# Patient Record
Sex: Male | Born: 1965 | Race: Black or African American | Hispanic: No | Marital: Married | Smoking: Never smoker
Health system: Southern US, Community
[De-identification: ages and names within clinical notes are randomized; demographics above are authoritative.]

## PROBLEM LIST (undated history)

## (undated) DIAGNOSIS — E785 Hyperlipidemia, unspecified: Secondary | ICD-10-CM

## (undated) DIAGNOSIS — I10 Essential (primary) hypertension: Secondary | ICD-10-CM

---

## 2001-07-20 ENCOUNTER — Ambulatory Visit: Admission: RE | Admit: 2001-07-20 | Discharge: 2001-07-20 | Payer: Self-pay | Admitting: Critical Care Medicine

## 2001-07-20 ENCOUNTER — Encounter: Payer: Self-pay | Admitting: Critical Care Medicine

## 2011-05-03 ENCOUNTER — Emergency Department (INDEPENDENT_AMBULATORY_CARE_PROVIDER_SITE_OTHER): Payer: Managed Care, Other (non HMO)

## 2011-05-03 ENCOUNTER — Emergency Department (HOSPITAL_BASED_OUTPATIENT_CLINIC_OR_DEPARTMENT_OTHER)
Admission: EM | Admit: 2011-05-03 | Discharge: 2011-05-03 | Discharge status: Against Medical Advice | Payer: Managed Care, Other (non HMO) | Attending: Emergency Medicine | Admitting: Emergency Medicine

## 2011-05-03 ENCOUNTER — Other Ambulatory Visit: Payer: Self-pay

## 2011-05-03 ENCOUNTER — Encounter: Payer: Self-pay | Admitting: *Deleted

## 2011-05-03 DIAGNOSIS — R55 Syncope and collapse: Secondary | ICD-10-CM | POA: Insufficient documentation

## 2011-05-03 DIAGNOSIS — R079 Chest pain, unspecified: Secondary | ICD-10-CM | POA: Insufficient documentation

## 2011-05-03 DIAGNOSIS — R110 Nausea: Secondary | ICD-10-CM | POA: Insufficient documentation

## 2011-05-03 DIAGNOSIS — R1013 Epigastric pain: Secondary | ICD-10-CM

## 2011-05-03 DIAGNOSIS — R0602 Shortness of breath: Secondary | ICD-10-CM

## 2011-05-03 DIAGNOSIS — R9431 Abnormal electrocardiogram [ECG] [EKG]: Secondary | ICD-10-CM | POA: Insufficient documentation

## 2011-05-03 DIAGNOSIS — E785 Hyperlipidemia, unspecified: Secondary | ICD-10-CM | POA: Insufficient documentation

## 2011-05-03 HISTORY — DX: Hyperlipidemia, unspecified: E78.5

## 2011-05-03 LAB — DIFFERENTIAL
Basophils Absolute: 0 10*3/uL (ref 0.0–0.1)
Basophils Relative: 0 % (ref 0–1)
Eosinophils Absolute: 0.1 10*3/uL (ref 0.0–0.7)
Eosinophils Relative: 2 % (ref 0–5)
Lymphocytes Relative: 32 % (ref 12–46)
Lymphs Abs: 1.6 10*3/uL (ref 0.7–4.0)
Monocytes Absolute: 0.5 10*3/uL (ref 0.1–1.0)
Monocytes Relative: 10 % (ref 3–12)
Neutro Abs: 2.9 10*3/uL (ref 1.7–7.7)
Neutrophils Relative %: 56 % (ref 43–77)

## 2011-05-03 LAB — CBC
HCT: 43.5 % (ref 39.0–52.0)
Hemoglobin: 14.8 g/dL (ref 13.0–17.0)
MCH: 26.4 pg (ref 26.0–34.0)
MCHC: 34 g/dL (ref 30.0–36.0)
MCV: 77.5 fL — ABNORMAL LOW (ref 78.0–100.0)
Platelets: 204 10*3/uL (ref 150–400)
RBC: 5.61 MIL/uL (ref 4.22–5.81)
RDW: 13.9 % (ref 11.5–15.5)
WBC: 5.1 10*3/uL (ref 4.0–10.5)

## 2011-05-03 LAB — COMPREHENSIVE METABOLIC PANEL
ALT: 17 U/L (ref 0–53)
AST: 22 U/L (ref 0–37)
Alkaline Phosphatase: 62 U/L (ref 39–117)
CO2: 25 mEq/L (ref 19–32)
Calcium: 8.9 mg/dL (ref 8.4–10.5)
Chloride: 102 mEq/L (ref 96–112)
GFR calc non Af Amer: 60 mL/min — ABNORMAL LOW (ref >60)
Potassium: 4 mEq/L (ref 3.5–5.1)
Sodium: 138 mEq/L (ref 135–145)

## 2011-05-03 LAB — CARDIAC PANEL(CRET KIN+CKTOT+MB+TROPI): CK, MB: 1.6 ng/mL (ref 0.3–4.0)

## 2011-05-03 MED ORDER — IOHEXOL 350 MG/ML SOLN
80.0000 mL | Freq: Once | INTRAVENOUS | Status: AC | PRN
  Administered 2011-05-03: 80 mL via INTRAVENOUS

## 2011-05-03 MED ORDER — ASPIRIN 81 MG PO CHEW
324.0000 mg | CHEWABLE_TABLET | Freq: Once | ORAL | Status: AC
  Administered 2011-05-03: 324 mg via ORAL
  Filled 2011-05-03: qty 4

## 2011-05-03 NOTE — ED Provider Notes (Signed)
History     CSN: [id number] Arrival date & time: 05/03/2011 11:43 AM  Chief Complaint  Patient presents with  . Near Syncope  . Abdominal Pain  . Nausea   Patient is a 45 y.o. male presenting with abdominal pain. The history is provided by the patient. No language interpreter was used.  Abdominal Pain The primary symptoms of the illness include abdominal pain and nausea. The primary symptoms of the illness do not include shortness of breath or vomiting. Primary symptoms comment: epigastric pain without radiation episodic x 5-6 The onset of the illness was gradual. The problem has been gradually worsening (episodic).  The patient states that she believes she is currently not pregnant. The patient has not had a change in bowel habit. Additional symptoms associated with the illness include diaphoresis. Symptoms associated with the illness do not include chills, anorexia, constipation, urgency, hematuria, frequency or back pain. Significant associated medical issues do not include PUD, GERD, inflammatory bowel disease, diabetes, sickle cell disease, gallstones, liver disease, substance abuse, diverticulitis or HIV.    Past Medical History  Diagnosis Date  . Hyperlipidemia     History reviewed. No pertinent past surgical history.  History reviewed. No pertinent family history.  History  Substance Use Topics  . Smoking status: Never Smoker   . Smokeless tobacco: Never Used  . Alcohol Use: 1.0 oz/week    2 drink(s) per week      Review of Systems  Constitutional: Positive for diaphoresis. Negative for chills.  HENT: Positive for facial swelling.   Respiratory: Negative for shortness of breath and wheezing.        Dyspnea on exertion  Cardiovascular: Negative for palpitations.  Gastrointestinal: Positive for nausea and abdominal pain. Negative for vomiting, constipation, blood in stool, abdominal distention and anorexia.  Genitourinary: Negative for urgency, frequency and hematuria.    Musculoskeletal: Negative for back pain.  Skin: Negative.   Neurological: Positive for syncope.  Hematological: Negative.   Psychiatric/Behavioral: Negative.     Physical Exam  BP 150/107  Pulse 112  Temp(Src) 98.7 F (37.1 C) (Oral)  Resp 20  SpO2 99%  Physical Exam  Constitutional: He is oriented to person, place, and time. He appears well-developed and well-nourished. No distress.  HENT:  Head: Normocephalic and atraumatic.  Eyes: EOM are normal. Pupils are equal, round, and reactive to light.  Neck: Normal range of motion. Neck supple.  Cardiovascular: Normal rate and regular rhythm.   No murmur heard. Pulmonary/Chest: Effort normal and breath sounds normal. No respiratory distress.  Abdominal: Soft. Bowel sounds are normal. He exhibits no distension and no mass. There is no tenderness. There is no rebound and no guarding.  Musculoskeletal: Normal range of motion.  Neurological: He is alert and oriented to person, place, and time.  Skin: Skin is warm and dry.    ED Course  Procedures  MDM   Date: 05/03/2011  Rate: 101  Rhythm: sinus tachycardia  QRS Axis: normal  Intervals: normal  ST/T Wave abnormalities: nonspecific ST changes  Conduction Disutrbances:none  Narrative Interpretation: ST T changes inferiorly and laterally  Old EKG Reviewed: none available     Following admission patient decided he no longer wished to be admitted.  Nurse Jasmine December and EDP informed patient the risks of signing AMA were death, heart attack arrhythmia with death or disability.  Further syncope with trauma and morbidity due to trauma.  Patient verbalizes understanding of risks and is able to verbalizes that he understands the seriousness of his  condition and has the decision making capacity to refuse care.     Jasmine Awe, MD 05/03/11 [phone]

## 2011-05-03 NOTE — ED Notes (Signed)
Pt will be admitted to [facility] hospital room 462.

## 2011-05-03 NOTE — ED Notes (Signed)
Reports history of 5 to 6 days of upper abdominal pain and intermittent nausea today he was working in the garage and started having the pain then got "faint feeling"

## 2013-03-12 IMAGING — CR DG CHEST 2V
2 series · 2 of 2 positions shown · non-contrast
Comparison: None.

CLINICAL DATA: Epigastric pain

CHEST - 2 VIEW

[w chest pa]
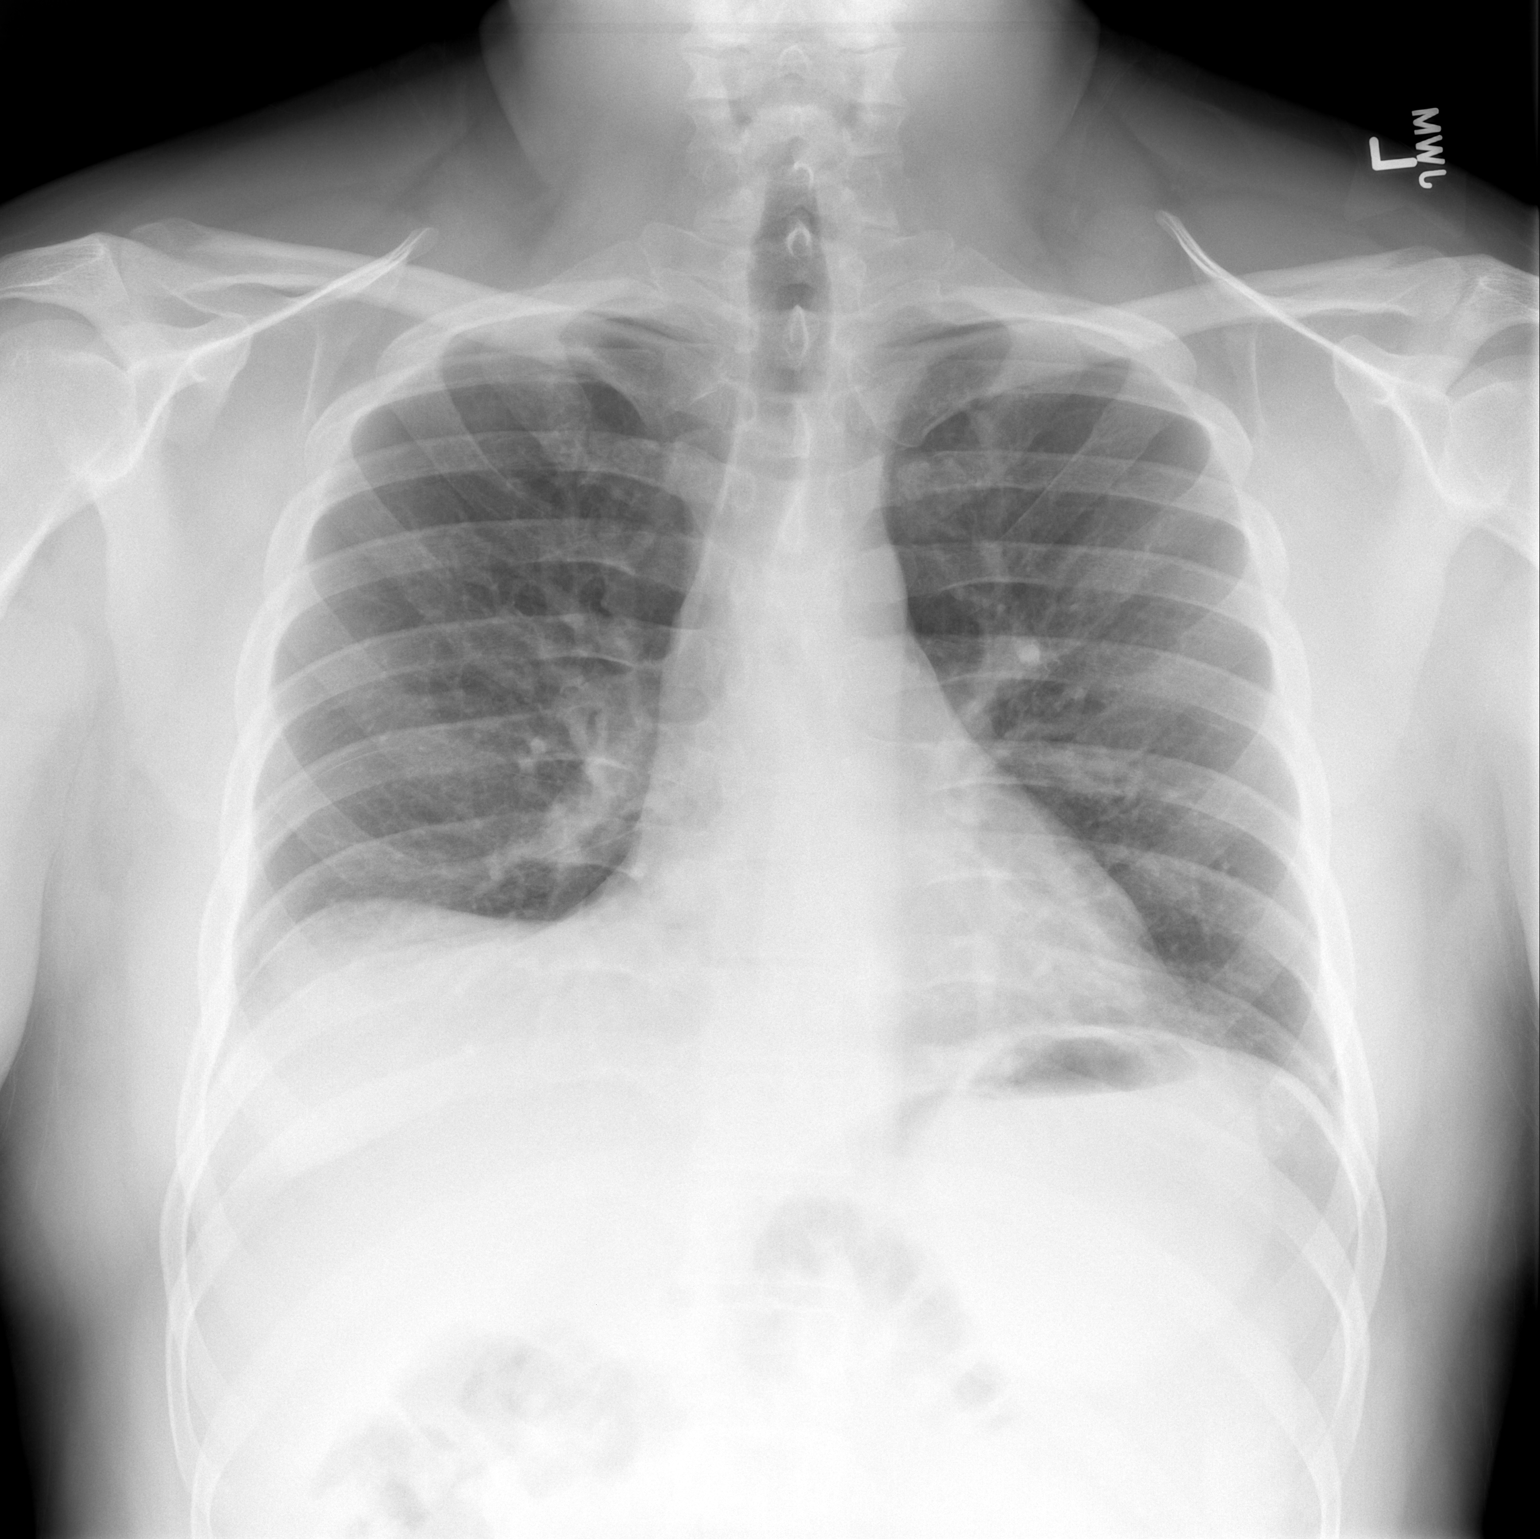

[w chest lat]
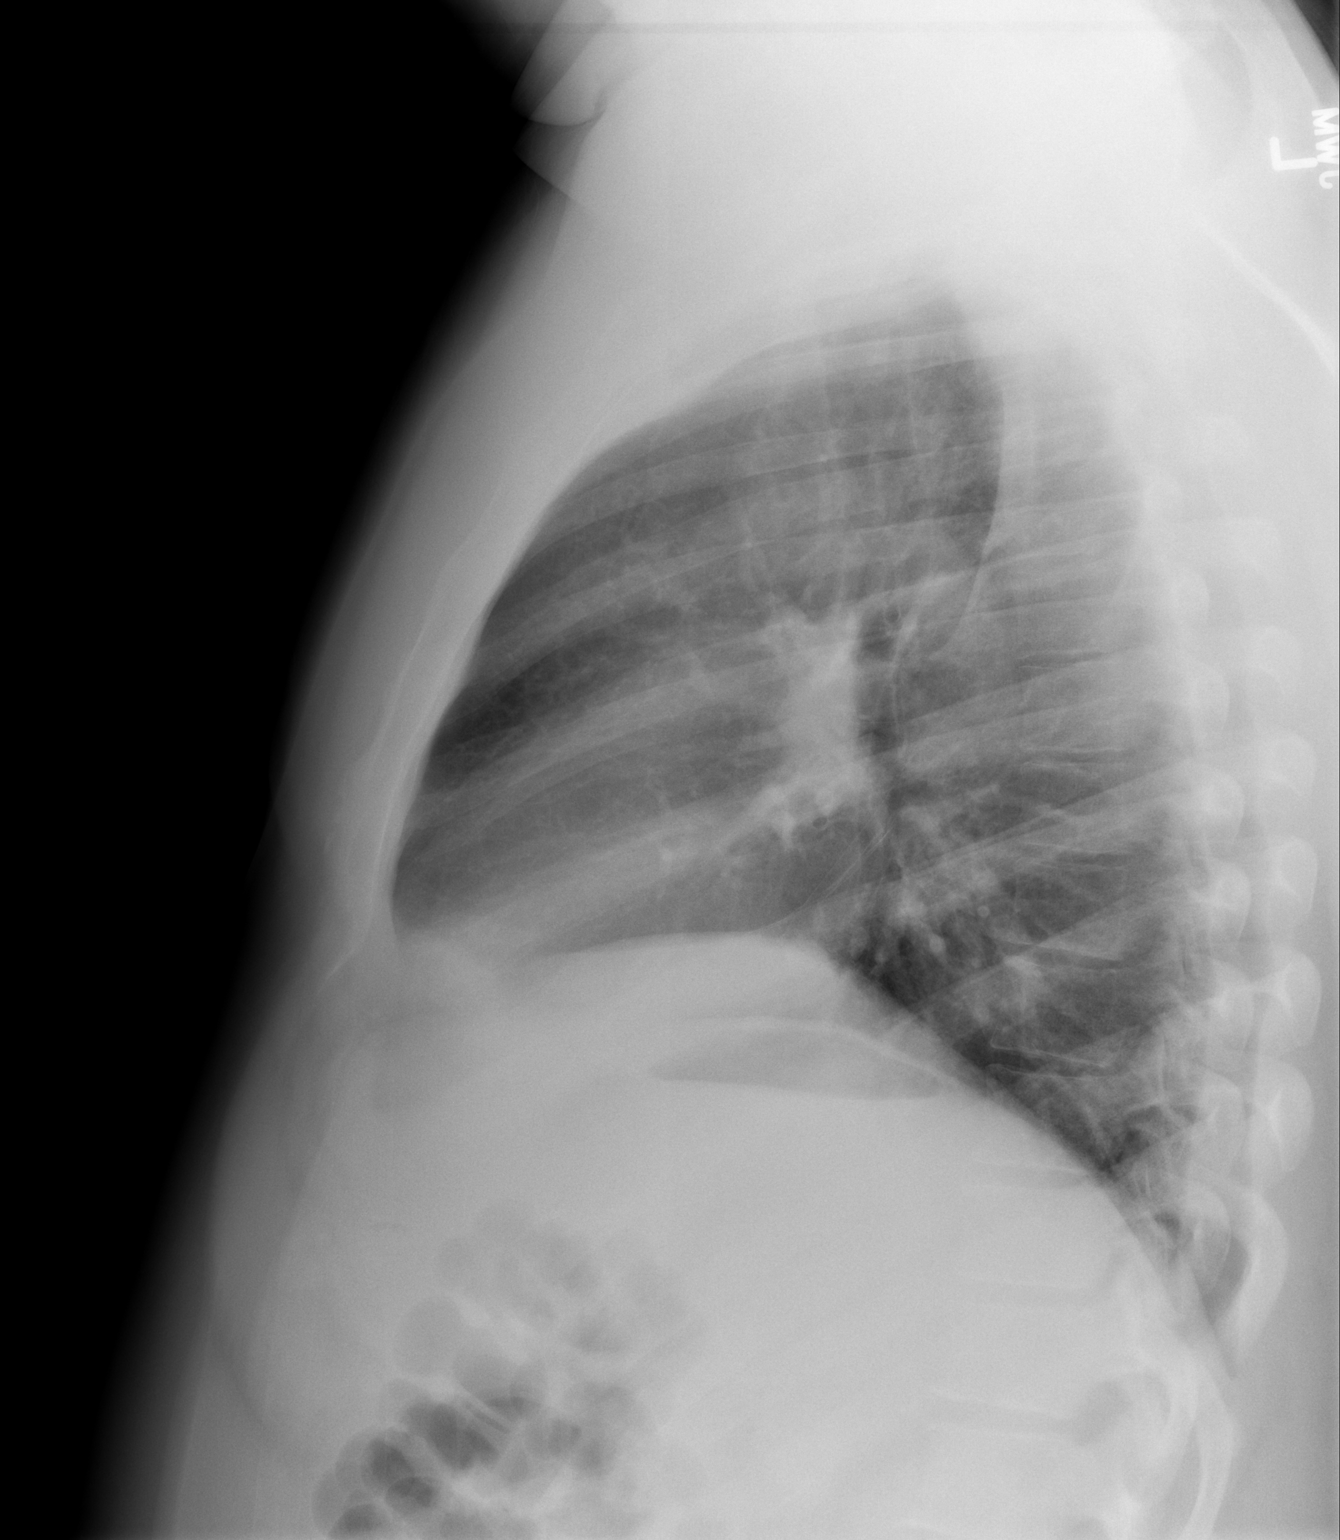

[2 of 2 positions shown; findings below may reference images not displayed]

FINDINGS: Normal heart size.  Clear lungs.  No pneumothorax and no
pleural effusion. Low volumes.
IMPRESSION: No active cardiopulmonary disease.

## 2013-03-12 IMAGING — CT CT ANGIO CHEST
2 of 6 series · 19 of 36 positions shown · IV contrast (APPLIED)
Comparison: None.

CLINICAL DATA: Shortness of breath, syncope.

CT ANGIOGRAPHY CHEST WITH CONTRAST
TECHNIQUE: Multidetector CT imaging of the chest was performed
using the standard protocol during bolus administration of
intravenous contrast.  Multiplanar CT image reconstructions
including MIPs were obtained to evaluate the vascular anatomy.
Contrast:  80 ml Omnipaque 300 IV.

[Series 6: pe 1.0 b25f · axial · 0.65mm/px · z∈[-250,-30]mm · 18 of 246 slices shown]
[im 13/246  lung]
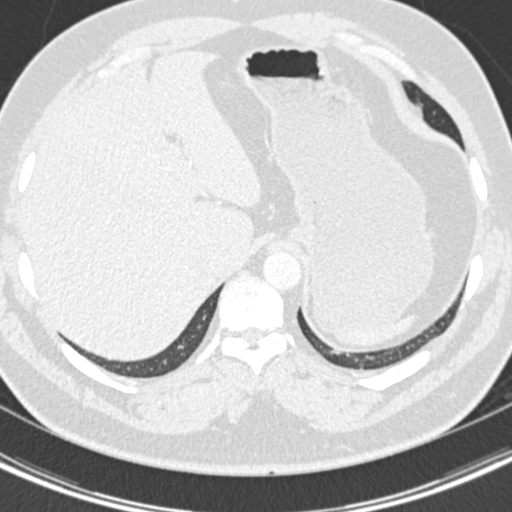
[im 25/246  mediastinal]
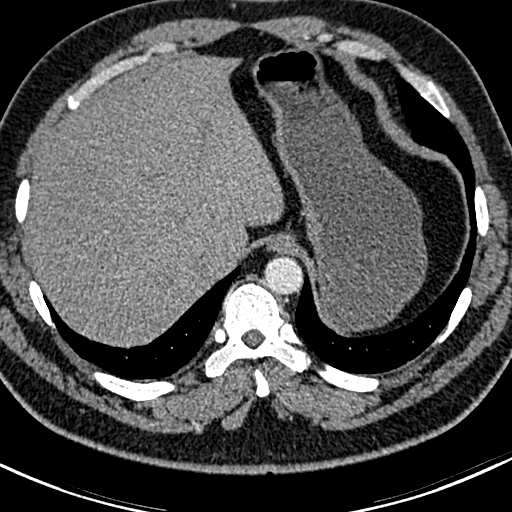
[im 37/246  lung]
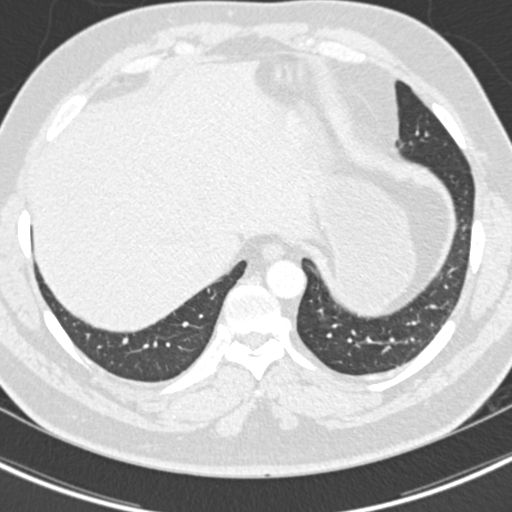
[im 50/246  mediastinal]
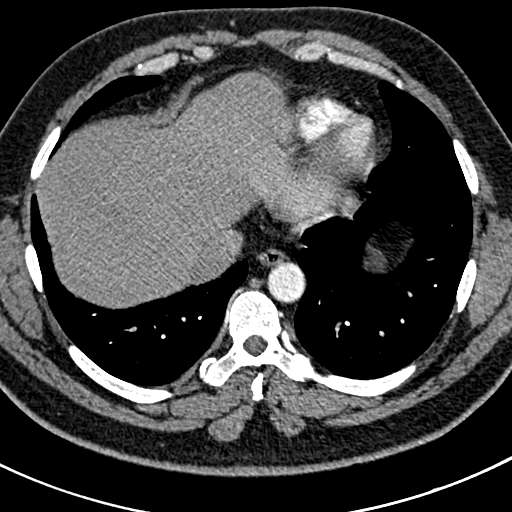
[im 62/246  lung]
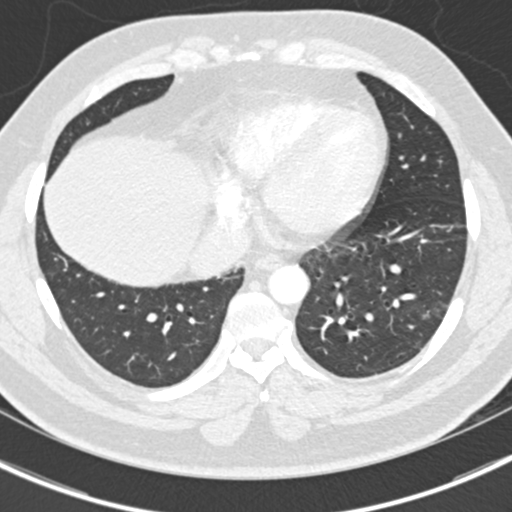
[im 74/246  mediastinal]
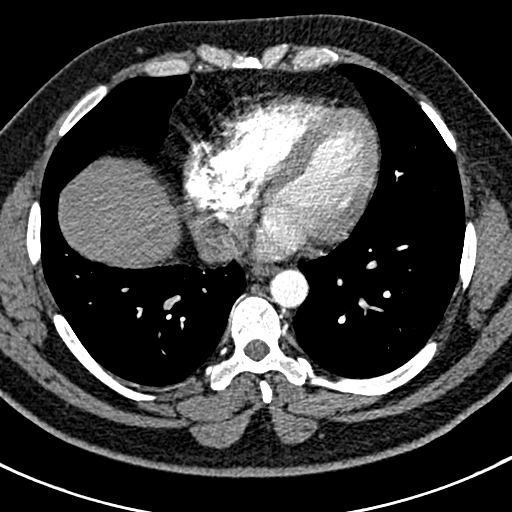
[im 86/246  lung]
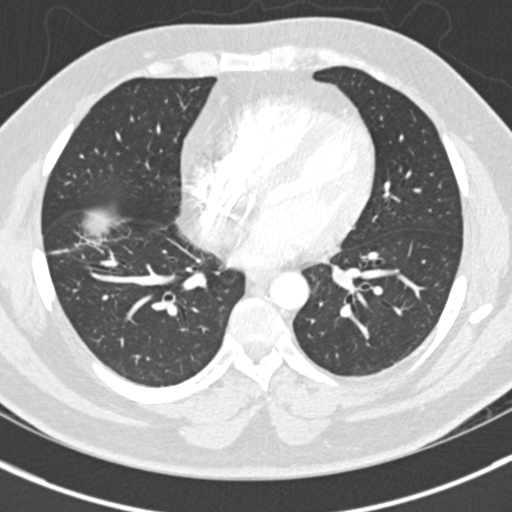
[im 99/246  mediastinal]
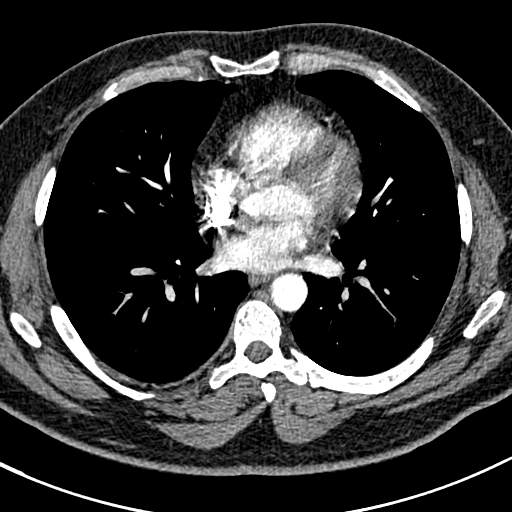
[im 111/246  lung]
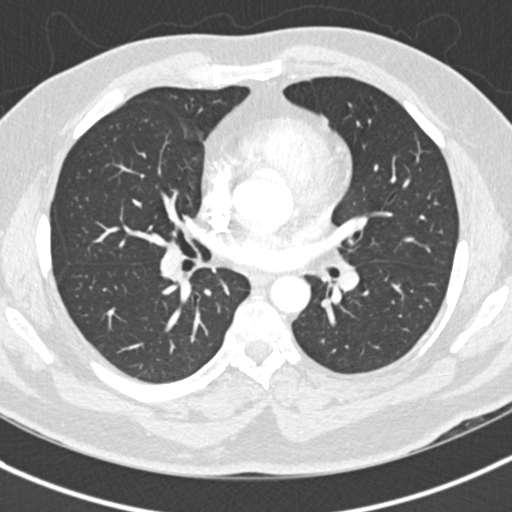
[im 135/246  mediastinal]
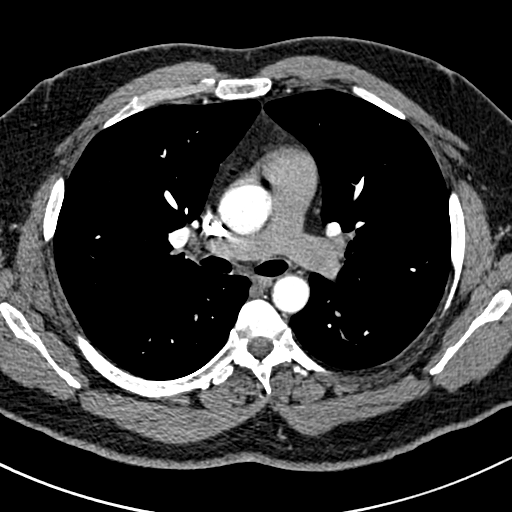
[im 148/246  lung]
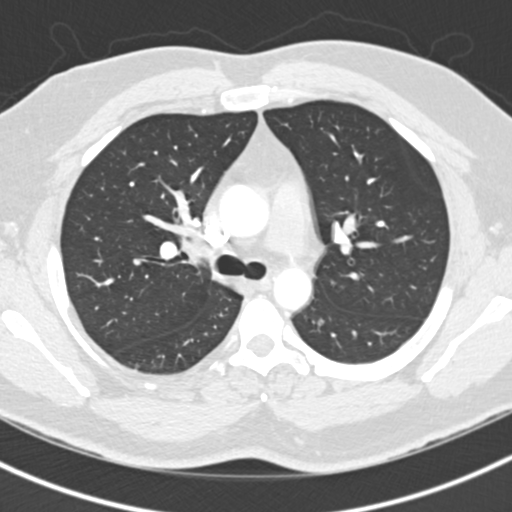
[im 160/246  mediastinal]
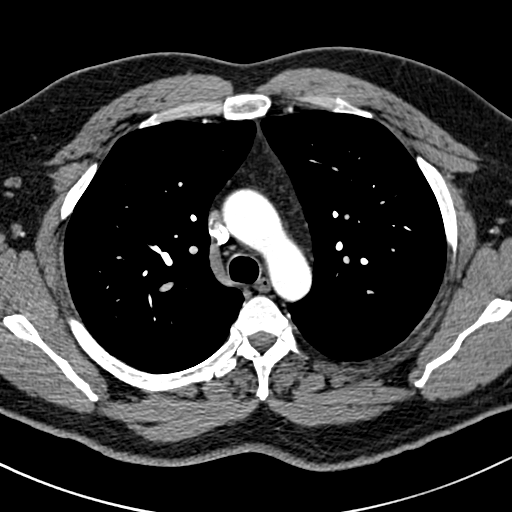
[im 172/246  lung]
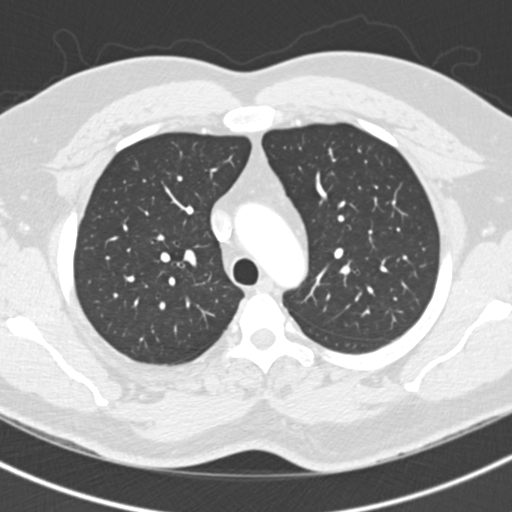
[im 184/246  mediastinal]
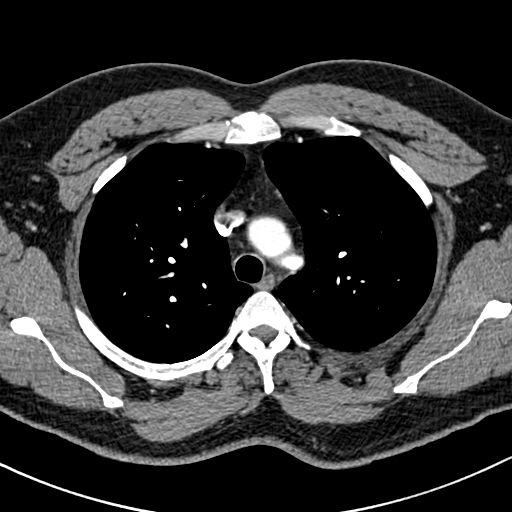
[im 197/246  lung]
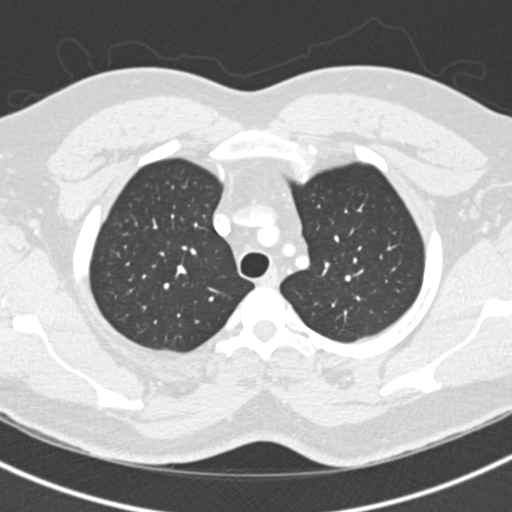
[im 209/246  mediastinal]
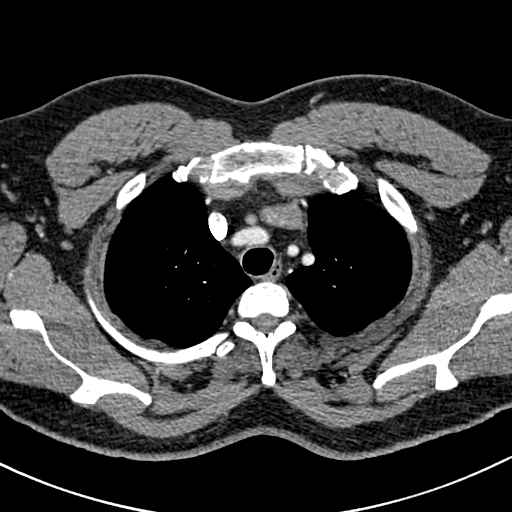
[im 221/246  lung]
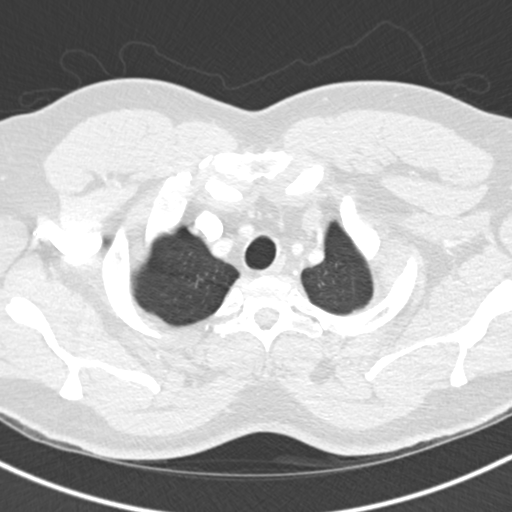
[im 233/246  mediastinal]
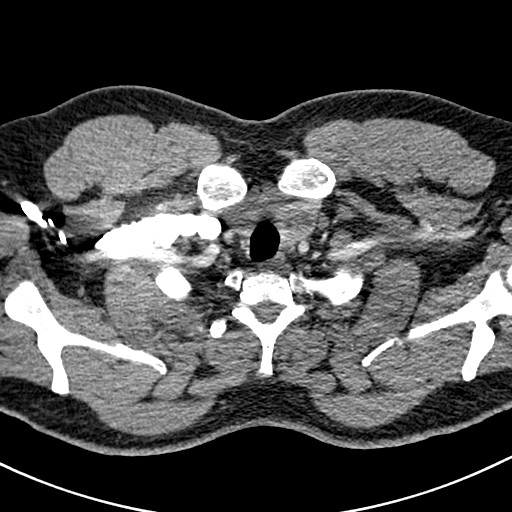

[Series 9: pe 2.0 coronal · coronal · 0.48mm/px · 1 of 145 slices shown]
[im 73/145  mediastinal]
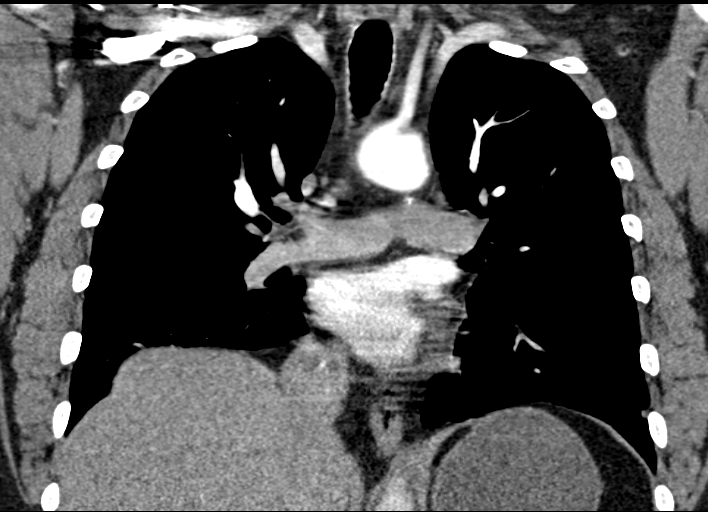

[19 of 36 positions shown; findings below may reference images not displayed]

FINDINGS: No filling defects in the pulmonary arteries to suggest
pulmonary emboli. Heart is normal size. Aorta is normal caliber.
No mediastinal, hilar, or axillary adenopathy.  Visualized thyroid
and chest wall soft tissues unremarkable. Lungs are clear.  No
focal airspace opacities or suspicious nodules.  No effusions.
Imaging into the upper abdomen shows no acute findings.

Review of the MIP images confirms the above findings.
IMPRESSION: No evidence of pulmonary embolus.

No acute findings.

## 2015-07-01 ENCOUNTER — Encounter (HOSPITAL_BASED_OUTPATIENT_CLINIC_OR_DEPARTMENT_OTHER): Payer: Self-pay

## 2015-07-01 ENCOUNTER — Emergency Department (HOSPITAL_BASED_OUTPATIENT_CLINIC_OR_DEPARTMENT_OTHER)
Admission: EM | Admit: 2015-07-01 | Discharge: 2015-07-01 | Disposition: A | Discharge status: Home / Self Care | Payer: 59 | Attending: Emergency Medicine | Admitting: Emergency Medicine

## 2015-07-01 DIAGNOSIS — E785 Hyperlipidemia, unspecified: Secondary | ICD-10-CM | POA: Diagnosis not present

## 2015-07-01 DIAGNOSIS — H539 Unspecified visual disturbance: Secondary | ICD-10-CM

## 2015-07-01 DIAGNOSIS — H538 Other visual disturbances: Secondary | ICD-10-CM | POA: Insufficient documentation

## 2015-07-01 DIAGNOSIS — I10 Essential (primary) hypertension: Secondary | ICD-10-CM

## 2015-07-01 HISTORY — DX: Essential (primary) hypertension: I10

## 2015-07-01 MED ORDER — LOSARTAN POTASSIUM 100 MG PO TABS: 50.0000 mg | ORAL_TABLET | Freq: Every day | ORAL | Status: AC

## 2015-07-01 NOTE — ED Provider Notes (Signed)
CSN: [id number]     Arrival date & time 07/01/15  1643 History   First MD Initiated Contact with Patient 07/01/15 1735     Chief Complaint  Patient presents with  . Visual Field Change   HPI   49 year old male presents today with facial changes. Patient reports that he was at work today and noticed a change in his vision, describes it is blurry. He reports at that time he went to the local fire station and had his blood pressure checked which was found to be in the low 200s systolic. Patient reports that at that time he was extremely anxious, with deep breathing was able to get his anxiety and control, with subsequent lowering of his blood pressure. He notes after his blood pressure lowered he did not have any visual complaints. At the time of evaluation patient reports he is back to his normal baseline, he did eyes changes in vision, smell, taste, headache, chest pain, shortness of breath, abdominal pain, or changes in the color clarity or characteristics of his urine. Patient reports that he used to take blood pressure medication taking losartan 10 mg daily, but only took it for sure. At times it causes nausea. Patient is uncertain if this was due to taking and on empty stomach or if it was related to the medication. Patient notes that he's been under significant stress lately as his mother recently passed, has had difficulty sleeping. Patient reports a history of high cholesterol, currently taking statin. Patient has no complaints at the time my evaluation other than those noted above.  Past Medical History  Diagnosis Date  . Hyperlipidemia   . Hypertension    History reviewed. No pertinent past surgical history. No family history on file. Social History  Substance Use Topics  . Smoking status: Never Smoker   . Smokeless tobacco: Never Used  . Alcohol Use: Yes     Comment: occ    Review of Systems  All other systems reviewed and are negative.   Allergies  Review of patient's  allergies indicates no known allergies.  Home Medications   Prior to Admission medications   Medication Sig Start Date End Date Taking? Authorizing Provider  losartan (COZAAR) 100 MG tablet Take 0.5 tablets (50 mg total) by mouth daily. 07/01/15   Eyvonne Mechanic, PA-C  simvastatin (ZOCOR) 10 MG tablet Take 10 mg by mouth at bedtime.      Historical Provider, MD   BP 150/97 mmHg  Pulse 103  Temp(Src) 98 F (36.7 C) (Oral)  Resp 20  Ht 6' (1.829 m)  Wt 271 lb (122.925 kg)  BMI 36.75 kg/m2  SpO2 100%   Physical Exam  Constitutional: He is oriented to person, place, and time. He appears well-developed and well-nourished.  HENT:  Head: Normocephalic and atraumatic.  Eyes: Conjunctivae are normal. Pupils are equal, round, and reactive to light. Right eye exhibits no discharge. Left eye exhibits no discharge. No scleral icterus.  Neck: Normal range of motion. No JVD present. No tracheal deviation present.  Cardiovascular: Regular rhythm, normal heart sounds and intact distal pulses.  Exam reveals no gallop and no friction rub.   No murmur heard. Pulmonary/Chest: Effort normal and breath sounds normal. No stridor. No respiratory distress. He has no wheezes. He has no rales.  Abdominal: Soft. Bowel sounds are normal. He exhibits no mass. There is no tenderness. There is no rebound and no guarding.  Musculoskeletal: Normal range of motion. He exhibits no edema or tenderness.  Neurological:  He is alert and oriented to person, place, and time. He has normal strength. He is not disoriented. No cranial nerve deficit or sensory deficit. Coordination and gait normal. GCS eye subscore is 4. GCS verbal subscore is 5. GCS motor subscore is 6.  Skin: Skin is warm and dry. No rash noted. No erythema. No pallor.  Psychiatric: He has a normal mood and affect. His behavior is normal. Judgment and thought content normal.  Nursing note and vitals reviewed.   ED Course  Procedures (including critical care  time) Labs Review Labs Reviewed - No data to display  Imaging Review No results found. I have personally reviewed and evaluated these images and lab results as part of my medical decision-making.   EKG Interpretation None      MDM   Final diagnoses:  Visual disturbance  Essential hypertension    Labs:  Imaging:  Consults:  Therapeutics:   Discharge Meds: Losartan  Assessment/Plan: Patient presents with an episode of visual disturbance and hypertension. At the time my evaluation patient was asymptomatic with no complaints. This could likely represent a transient rise in his blood pressure. She has no signs of end organ damage at this time, he will be prescribed his antihypertensive medications, encouraged follow-up with his primary care for reevaluation and management of his chronic conditions. Patient verbalized understanding and agreement for today's plan, he assured his follow-up evaluation, he assured that he return to the emergency room if symptoms return.         Eyvonne Mechanic, PA-C 07/01/15 1478  Elwin Mocha, MD 07/01/15 2322

## 2015-07-01 NOTE — ED Notes (Signed)
Pa  at bedside. 

## 2015-07-01 NOTE — Discharge Instructions (Signed)
Please take medication as directed, consult her primary care provider for further evaluation and management of her hypertension. If symptoms return please return to the emergency room for further evaluation and management.

## 2015-07-01 NOTE — ED Notes (Addendum)
C/o visual disturbance started 3pm-[place] [facility] [place] and was advised elevated BP-pt states hx of HTN with meds x 1 month then stopped due to nausea but stopped x 4 months-felt like when you look into a flashlight and look away and the brightness continues-denies at this time

## 2015-07-02 ENCOUNTER — Telehealth: Payer: Self-pay | Admitting: *Deleted

## 2015-07-02 NOTE — Telephone Encounter (Signed)
Pt called inquiring about dosage of Rx.  Pt states his previous dosage was 12.5mg  and he was prescribed .  NCM suggested he call/visit his PCP for adjustment as ER does not manage medications.  Pt verbalized understanding.

## 2017-06-17 ENCOUNTER — Encounter (HOSPITAL_BASED_OUTPATIENT_CLINIC_OR_DEPARTMENT_OTHER): Payer: Self-pay | Admitting: *Deleted

## 2017-06-17 ENCOUNTER — Emergency Department (HOSPITAL_BASED_OUTPATIENT_CLINIC_OR_DEPARTMENT_OTHER): Payer: Worker's Compensation

## 2017-06-17 ENCOUNTER — Emergency Department (HOSPITAL_BASED_OUTPATIENT_CLINIC_OR_DEPARTMENT_OTHER)
Admission: EM | Admit: 2017-06-17 | Discharge: 2017-06-17 | Disposition: A | Discharge status: Home / Self Care | Payer: Worker's Compensation | Attending: Emergency Medicine | Admitting: Emergency Medicine

## 2017-06-17 DIAGNOSIS — E785 Hyperlipidemia, unspecified: Secondary | ICD-10-CM | POA: Diagnosis not present

## 2017-06-17 DIAGNOSIS — M25562 Pain in left knee: Secondary | ICD-10-CM | POA: Insufficient documentation

## 2017-06-17 DIAGNOSIS — I10 Essential (primary) hypertension: Secondary | ICD-10-CM | POA: Insufficient documentation

## 2017-06-17 DIAGNOSIS — Z79899 Other long term (current) drug therapy: Secondary | ICD-10-CM | POA: Diagnosis not present

## 2017-06-17 MED ORDER — MELOXICAM 7.5 MG PO TABS: 15.0000 mg | ORAL_TABLET | Freq: Every day | ORAL | 0 refills | Status: AC

## 2017-06-17 NOTE — Discharge Instructions (Signed)
Take the prescribed medication as directed.  Can ice and elevate at home to try and help with symptoms if desired. Follow-up with your primary care doctor. Return to the ED for new or worsening symptoms.

## 2017-06-17 NOTE — ED Triage Notes (Signed)
Pt says he was at work trying to take a picture off a wall and his knee buckled and fell against the wall. He c/o pain in the left knee and request UDS for work.

## 2017-06-17 NOTE — ED Provider Notes (Signed)
[facility]   CSN: [id number] Arrival date & time: 06/17/17  1753     History   Chief Complaint Chief Complaint  Patient presents with  . Knee Pain    HPI Darin Winters is a 51 y.o. male.  The history is provided by the patient and medical records.  Knee Pain      51 year old male with history of hyperlipidemia and hypertension, presenting to the ED for left knee pain. Patient reports he was hanging up a picture at work today when he tripped causing his left knee hit the wall. He did not fall to the ground. States since then he has had some "pressure" in his left knee when walking.  States he has felt some "popping" and some "grinding" as well. He denies any numbness or weakness of the leg. No prior left knee injuries or surgeries.  Past Medical History:  Diagnosis Date  . Hyperlipidemia   . Hypertension     There are no active problems to display for this patient.   History reviewed. No pertinent surgical history.     Home Medications    Prior to Admission medications   Medication Sig Start Date End Date Taking? Authorizing Provider  losartan (COZAAR) 100 MG tablet Take 0.5 tablets (50 mg total) by mouth daily. 07/01/15   Hedges, Tinnie Gens, PA-C  simvastatin (ZOCOR) 10 MG tablet Take 10 mg by mouth at bedtime.      [provider]    Family History No family history on file.  Social History Social History  Substance Use Topics  . Smoking status: Never Smoker  . Smokeless tobacco: Never Used  . Alcohol use Yes     Comment: occ     Allergies   Patient has no known allergies.   Review of Systems Review of Systems  Musculoskeletal: Positive for arthralgias.  All other systems reviewed and are negative.    Physical Exam Updated Vital Signs BP (!) 152/103 (BP Location: Right Arm)   Pulse 96   Temp 98.3 F (36.8 C) (Oral)   Resp 16   Ht 6' (1.829 m)   Wt 122.5 kg (270 lb)   SpO2 99%   BMI 36.62 kg/m    Physical Exam  Constitutional: He is oriented to person, place, and time. He appears well-developed and well-nourished.  HENT:  Head: Normocephalic and atraumatic.  Mouth/Throat: Oropharynx is clear and moist.  Eyes: Pupils are equal, round, and reactive to light. Conjunctivae and EOM are normal.  Neck: Normal range of motion.  Cardiovascular: Normal rate, regular rhythm and normal heart sounds.   Pulmonary/Chest: Effort normal and breath sounds normal. No respiratory distress. He has no wheezes.  Abdominal: Soft. Bowel sounds are normal. There is no tenderness. There is no rebound.  Musculoskeletal: Normal range of motion.  Left knee overall normal in appearance, no evidence of bruising or swelling, no effusion, mild crepitus noted from the patella with flexion and extension but no difficulty or apparent pain with doing so, ambulatory with steady gait  Neurological: He is alert and oriented to person, place, and time.  Skin: Skin is warm and dry.  Psychiatric: He has a normal mood and affect.  Nursing note and vitals reviewed.    ED Treatments / Results  Labs (all labs ordered are listed, but only abnormal results are displayed) Labs Reviewed - No data to display  EKG  EKG Interpretation None       Radiology Dg Knee Complete 4 Views  Left  Result Date: 06/17/2017 CLINICAL DATA:  51 y/o  M; injury with pain. EXAM: LEFT KNEE - COMPLETE 4+ VIEW COMPARISON:  None. FINDINGS: No acute fracture, dislocation, or joint effusion. Mild medial femorotibial compartment joint space narrowing. Spurring of tibial spines. Calcifications within the quadriceps tendon insertion on the patella may represent calcific tendinitis. IMPRESSION: 1. No acute fracture or dislocation identified. 2. Mild osteoarthrosis of the knee with mild medial femorotibial compartment joint space narrowing. Electronically Signed   By: Mitzi Hansen M.D.   On: 06/17/2017 18:42    Procedures Procedures  (including critical care time)  Medications Ordered in ED Medications - No data to display   Initial Impression / Assessment and Plan / ED Course  I have reviewed the triage vital signs and the nursing notes.  Pertinent labs & imaging results that were available during my care of the patient were reviewed by me and considered in my medical decision making (see chart for details).  51 year old male here with left knee pain. Was changing a picture on the wall at work when he lost his footing and fell, impacting the left knee on did not fall to the ground. Has been ambulatory since But reports some "pressure" in the knee. He also feels some popping and grinding. There is no acute deformity on exam. No significant swelling or effusion. No ligamentous laxity.  X-ray obtained, arthritic changes noted but no acute findings. Results discussed with patient. Will start anti-inflammatories. Urine drug screen was performed at patient's request. He was given copies of his x-ray. Close follow-up with PCP.  Discussed plan with patient, he acknowledged understanding and agreed with plan of care.  Return precautions given for new or worsening symptoms.  Final Clinical Impressions(s) / ED Diagnoses   Final diagnoses:  Acute pain of left knee    New Prescriptions Discharge Medication List as of 06/17/2017  7:00 PM    START taking these medications   Details  meloxicam (MOBIC) 7.5 MG tablet Take 2 tablets (15 mg total) by mouth daily., Starting Sat 06/17/2017, Print         Garlon Hatchet, PA-C 06/17/17 Cherie Dark, MD 06/18/17 [phone]

## 2018-12-24 ENCOUNTER — Emergency Department (HOSPITAL_BASED_OUTPATIENT_CLINIC_OR_DEPARTMENT_OTHER): Payer: 59

## 2018-12-24 ENCOUNTER — Emergency Department (HOSPITAL_BASED_OUTPATIENT_CLINIC_OR_DEPARTMENT_OTHER)
Admission: EM | Admit: 2018-12-24 | Discharge: 2018-12-24 | Disposition: A | Discharge status: Home / Self Care | Payer: 59 | Attending: Emergency Medicine | Admitting: Emergency Medicine

## 2018-12-24 ENCOUNTER — Other Ambulatory Visit: Payer: Self-pay

## 2018-12-24 DIAGNOSIS — F419 Anxiety disorder, unspecified: Secondary | ICD-10-CM | POA: Diagnosis not present

## 2018-12-24 DIAGNOSIS — R0600 Dyspnea, unspecified: Secondary | ICD-10-CM | POA: Insufficient documentation

## 2018-12-24 DIAGNOSIS — I10 Essential (primary) hypertension: Secondary | ICD-10-CM | POA: Insufficient documentation

## 2018-12-24 DIAGNOSIS — R002 Palpitations: Secondary | ICD-10-CM | POA: Diagnosis not present

## 2018-12-24 DIAGNOSIS — R0602 Shortness of breath: Secondary | ICD-10-CM | POA: Diagnosis present

## 2018-12-24 LAB — COMPREHENSIVE METABOLIC PANEL
ALK PHOS: 54 U/L (ref 38–126)
ALT: 22 U/L (ref 0–44)
AST: 21 U/L (ref 15–41)
Albumin: 3.9 g/dL (ref 3.5–5.0)
Anion gap: 10 (ref 5–15)
BILIRUBIN TOTAL: 0.7 mg/dL (ref 0.3–1.2)
BUN: 14 mg/dL (ref 6–20)
CALCIUM: 9.1 mg/dL (ref 8.9–10.3)
CHLORIDE: 103 mmol/L (ref 98–111)
CO2: 24 mmol/L (ref 22–32)
CREATININE: 1.28 mg/dL — AB (ref 0.61–1.24)
GFR calc Af Amer: >60 mL/min (ref >60)
Glucose, Bld: 111 mg/dL — ABNORMAL HIGH (ref 70–99)
Potassium: 3.8 mmol/L (ref 3.5–5.1)
Sodium: 137 mmol/L (ref 135–145)
TOTAL PROTEIN: 7.2 g/dL (ref 6.5–8.1)

## 2018-12-24 LAB — CBC WITH DIFFERENTIAL/PLATELET
ABS IMMATURE GRANULOCYTES: 0.02 10*3/uL (ref 0.00–0.07)
Basophils Absolute: 0 10*3/uL (ref 0.0–0.1)
Basophils Relative: 0 %
Eosinophils Absolute: 0.1 10*3/uL (ref 0.0–0.5)
Eosinophils Relative: 2 %
HCT: 48.7 % (ref 39.0–52.0)
HEMOGLOBIN: 15.5 g/dL (ref 13.0–17.0)
IMMATURE GRANULOCYTES: 0 %
LYMPHS ABS: 1.9 10*3/uL (ref 0.7–4.0)
Lymphocytes Relative: 34 %
MCH: 26.1 pg (ref 26.0–34.0)
MCHC: 31.8 g/dL (ref 30.0–36.0)
MCV: 82.1 fL (ref 80.0–100.0)
MONOS PCT: 11 %
Monocytes Absolute: 0.6 10*3/uL (ref 0.1–1.0)
NEUTROS ABS: 3.1 10*3/uL (ref 1.7–7.7)
NEUTROS PCT: 53 %
Platelets: 202 10*3/uL (ref 150–400)
RBC: 5.93 MIL/uL — ABNORMAL HIGH (ref 4.22–5.81)
RDW: 13.6 % (ref 11.5–15.5)
WBC: 5.7 10*3/uL (ref 4.0–10.5)
nRBC: 0 % (ref 0.0–0.2)

## 2018-12-24 LAB — URINALYSIS, ROUTINE W REFLEX MICROSCOPIC
BILIRUBIN URINE: NEGATIVE
Glucose, UA: NEGATIVE mg/dL
Hgb urine dipstick: NEGATIVE
Ketones, ur: 15 mg/dL — AB
Leukocytes,Ua: NEGATIVE
NITRITE: NEGATIVE
PROTEIN: NEGATIVE mg/dL
pH: 5.5 (ref 5.0–8.0)

## 2018-12-24 LAB — TROPONIN I

## 2018-12-24 LAB — LIPASE, BLOOD: Lipase: 43 U/L (ref 11–51)

## 2018-12-24 LAB — BRAIN NATRIURETIC PEPTIDE: B NATRIURETIC PEPTIDE 5: 9.7 pg/mL (ref 0.0–100.0)

## 2018-12-24 MED ORDER — ASPIRIN 81 MG PO CHEW
324.0000 mg | CHEWABLE_TABLET | Freq: Once | ORAL | Status: AC
  Administered 2018-12-24: 324 mg via ORAL
  Filled 2018-12-24: qty 4

## 2018-12-24 MED ORDER — IOHEXOL 350 MG/ML SOLN
100.0000 mL | Freq: Once | INTRAVENOUS | Status: AC | PRN
  Administered 2018-12-24: 100 mL via INTRAVENOUS

## 2018-12-24 MED ORDER — PANTOPRAZOLE SODIUM 20 MG PO TBEC: 20.0000 mg | DELAYED_RELEASE_TABLET | Freq: Every day | ORAL | 0 refills | Status: DC

## 2018-12-24 NOTE — ED Triage Notes (Signed)
Pt reports shortness of breath x 4 days , denies cough nor fever. Denies chest pain yet reports palpitation and feeling anxious, reports took Tums with no relief, no known Hx of GERD. Alert and oriented x 4.

## 2018-12-24 NOTE — ED Provider Notes (Signed)
Emergency Department Provider Note   I have reviewed the triage vital signs and the nursing notes.   HISTORY  Chief Complaint Shortness of Breath   HPI Darin Winters is a 53 y.o. male with history of hypertension and hyperlipidemia the presents the emergency department today with shortness of breath.  Patient states he is noticed last 4 nights that he has been short of breath when lying flat wakes up feeling anxious with palpitations and worsening dyspnea.  No lower extremity swelling.  No exertional dyspnea he mowed his lawn the other day without any issues.  Patient also does not have any history of blood clots, heart disease, heart failure.  He states that he has been urinating more than normal recently.  He is not any fever, trauma, cough or other associated symptoms. No other associated or modifying symptoms.    Past Medical History:  Diagnosis Date  . Hyperlipidemia   . Hypertension     There are no active problems to display for this patient.   No past surgical history on file.  Current Outpatient Rx  . Order #: [id number] Class: Print  . Order #: [id number] Class: Print  . Order #: [id number] Class: Print  . Order #: [id number] Class: Historical Med    Allergies Patient has no known allergies.  No family history on file.  Social History Social History   Tobacco Use  . Smoking status: Never Smoker  . Smokeless tobacco: Never Used  Substance Use Topics  . Alcohol use: Yes    Comment: occ  . Drug use: No    Review of Systems  All other systems negative except as documented in the HPI. All pertinent positives and negatives as reviewed in the HPI. ____________________________________________   PHYSICAL EXAM:  VITAL SIGNS: ED Triage Vitals  Enc Vitals Group     BP 12/24/18 0743 (!) 147/99     Pulse Rate 12/24/18 0743 (!) 115     Resp 12/24/18 0743 16     Temp 12/24/18 0743 98.1 F (36.7 C)     Temp Source 12/24/18 0743 Oral     SpO2 12/24/18 0743 99 %     Weight 12/24/18 0743 272 lb (123.4 kg)     Height 12/24/18 0743 6' (1.829 m)     Head Circumference --      Peak Flow --      Pain Score 12/24/18 0747 0     Pain Loc --      Pain Edu? --      Excl. in GC? --     Constitutional: Alert and oriented. Well appearing and in no acute distress. Eyes: Conjunctivae are normal. PERRL. EOMI. Head: Atraumatic. Nose: No congestion/rhinnorhea. Mouth/Throat: Mucous membranes are moist.  Oropharynx non-erythematous. Neck: No stridor.  No meningeal signs.   Cardiovascular: tachycardic rate, regular rhythm. Good peripheral circulation. Grossly normal heart sounds.   Respiratory: Normal respiratory effort.  No retractions. Lungs CTAB. Gastrointestinal: Soft and nontender. No distention.  Musculoskeletal: No lower extremity tenderness nor edema. No gross deformities of extremities. Neurologic:  Normal speech and language. No gross focal neurologic deficits are appreciated.  Skin:  Skin is warm, dry and intact. No rash noted.   ____________________________________________   LABS (all labs ordered are listed, but only abnormal results are displayed)  Labs Reviewed  CBC WITH DIFFERENTIAL/PLATELET - Abnormal; Notable for the following components:      Result Value   RBC 5.93 (*)    All other components within normal limits  COMPREHENSIVE METABOLIC  PANEL - Abnormal; Notable for the following components:   Glucose, Bld 111 (*)    Creatinine, Ser 1.28 (*)    All other components within normal limits  URINALYSIS, ROUTINE W REFLEX MICROSCOPIC - Abnormal; Notable for the following components:   Specific Gravity, Urine >1.030 (*)    Ketones, ur 15 (*)    All other components within normal limits  TROPONIN I  LIPASE, BLOOD  BRAIN NATRIURETIC PEPTIDE   ____________________________________________  EKG   EKG Interpretation  Date/Time:    Ventricular Rate:    PR Interval:    QRS Duration:   QT Interval:    QTC Calculation:   R Axis:      Text Interpretation:         ____________________________________________  RADIOLOGY  Ct Angio Chest Pe W And/or Wo Contrast  Result Date: 12/24/2018 CLINICAL DATA:  Increasing shortness of breath over the past 4-5 days. EXAM: CT ANGIOGRAPHY CHEST WITH CONTRAST TECHNIQUE: Multidetector CT imaging of the chest was performed using the standard protocol during bolus administration of intravenous contrast. Multiplanar CT image reconstructions and MIPs were obtained to evaluate the vascular anatomy. CONTRAST:  100 mL OMNIPAQUE IOHEXOL 350 MG/ML SOLN COMPARISON:  CT chest 05/03/2011. FINDINGS: Cardiovascular: Satisfactory opacification of the pulmonary arteries to the segmental level. No evidence of pulmonary embolism. Mild cardiomegaly. No pericardial effusion. Mediastinum/Nodes: No enlarged mediastinal, hilar, or axillary lymph nodes. The trachea and esophagus demonstrate no significant findings. Mildly enlarged and heterogeneous left lobe of the thyroid is unchanged. Lungs/Pleura: Lungs are clear. No pleural effusion or pneumothorax. Upper Abdomen: Negative. Musculoskeletal: Negative. Review of the MIP images confirms the above findings. IMPRESSION: Negative for pulmonary embolus.  No acute disease. Electronically Signed   By: Drusilla Kanner M.D.   On: 12/24/2018 09:04    ____________________________________________   PROCEDURES  Procedure(s) performed:   Procedures   ____________________________________________   INITIAL IMPRESSION / ASSESSMENT AND PLAN / ED COURSE  Rule out PE and ACS.  Workup unremarkable. Will need pcp follow up.      Pertinent labs & imaging results that were available during my care of the patient were reviewed by me and considered in my medical decision making (see chart for details).  ____________________________________________  FINAL CLINICAL IMPRESSION(S) / ED DIAGNOSES  Final diagnoses:  Shortness of breath     MEDICATIONS GIVEN DURING THIS  VISIT:  Medications  aspirin chewable tablet 324 mg (324 mg Oral Given 12/24/18 0814)  iohexol (OMNIPAQUE) 350 MG/ML injection 100 mL (100 mLs Intravenous Contrast Given 12/24/18 0844)     NEW OUTPATIENT MEDICATIONS STARTED DURING THIS VISIT:  Discharge Medication List as of 12/24/2018 10:17 AM    START taking these medications   Details  pantoprazole (PROTONIX) 20 MG tablet Take 1 tablet (20 mg total) by mouth daily., Starting Mon 12/24/2018, Print        Note:  This note was prepared with assistance of Dragon voice recognition software. Occasional wrong-word or sound-a-like substitutions may have occurred due to the inherent limitations of voice recognition software.   Marily Memos, MD 12/24/18 1501

## 2018-12-24 NOTE — ED Notes (Signed)
Care handoff to [place], [place]

## 2021-09-30 ENCOUNTER — Emergency Department (HOSPITAL_BASED_OUTPATIENT_CLINIC_OR_DEPARTMENT_OTHER)
Admission: EM | Admit: 2021-09-30 | Discharge: 2021-09-30 | Disposition: A | Discharge status: Home / Self Care | Payer: 59 | Attending: Emergency Medicine | Admitting: Emergency Medicine

## 2021-09-30 ENCOUNTER — Encounter (HOSPITAL_BASED_OUTPATIENT_CLINIC_OR_DEPARTMENT_OTHER): Payer: Self-pay | Admitting: *Deleted

## 2021-09-30 ENCOUNTER — Emergency Department (HOSPITAL_BASED_OUTPATIENT_CLINIC_OR_DEPARTMENT_OTHER): Payer: 59

## 2021-09-30 ENCOUNTER — Other Ambulatory Visit: Payer: Self-pay

## 2021-09-30 DIAGNOSIS — R059 Cough, unspecified: Secondary | ICD-10-CM | POA: Diagnosis not present

## 2021-09-30 DIAGNOSIS — Z79899 Other long term (current) drug therapy: Secondary | ICD-10-CM | POA: Insufficient documentation

## 2021-09-30 DIAGNOSIS — R0602 Shortness of breath: Secondary | ICD-10-CM | POA: Diagnosis not present

## 2021-09-30 LAB — CBC WITH DIFFERENTIAL/PLATELET
Abs Immature Granulocytes: 0.02 10*3/uL (ref 0.00–0.07)
Basophils Absolute: 0 10*3/uL (ref 0.0–0.1)
Basophils Relative: 0 %
Eosinophils Absolute: 0.1 10*3/uL (ref 0.0–0.5)
Eosinophils Relative: 1 %
HCT: 47.6 % (ref 39.0–52.0)
Hemoglobin: 15.9 g/dL (ref 13.0–17.0)
Immature Granulocytes: 0 %
Lymphocytes Relative: 35 %
Lymphs Abs: 2.5 10*3/uL (ref 0.7–4.0)
MCH: 26.8 pg (ref 26.0–34.0)
MCHC: 33.4 g/dL (ref 30.0–36.0)
MCV: 80.3 fL (ref 80.0–100.0)
Monocytes Absolute: 0.8 10*3/uL (ref 0.1–1.0)
Monocytes Relative: 11 %
Neutro Abs: 3.8 10*3/uL (ref 1.7–7.7)
Neutrophils Relative %: 53 %
Platelets: 231 10*3/uL (ref 150–400)
RBC: 5.93 MIL/uL — ABNORMAL HIGH (ref 4.22–5.81)
RDW: 13.4 % (ref 11.5–15.5)
WBC: 7.1 10*3/uL (ref 4.0–10.5)
nRBC: 0 % (ref 0.0–0.2)

## 2021-09-30 LAB — COMPREHENSIVE METABOLIC PANEL
ALT: 32 U/L (ref 0–44)
AST: 25 U/L (ref 15–41)
Albumin: 4.2 g/dL (ref 3.5–5.0)
Alkaline Phosphatase: 53 U/L (ref 38–126)
Anion gap: 12 (ref 5–15)
BUN: 12 mg/dL (ref 6–20)
CO2: 24 mmol/L (ref 22–32)
Calcium: 9.3 mg/dL (ref 8.9–10.3)
Chloride: 98 mmol/L (ref 98–111)
Creatinine, Ser: 1.17 mg/dL (ref 0.61–1.24)
GFR, Estimated: >60 mL/min (ref >60)
Glucose, Bld: 103 mg/dL — ABNORMAL HIGH (ref 70–99)
Potassium: 3.3 mmol/L — ABNORMAL LOW (ref 3.5–5.1)
Sodium: 134 mmol/L — ABNORMAL LOW (ref 135–145)
Total Bilirubin: 0.8 mg/dL (ref 0.3–1.2)
Total Protein: 7.4 g/dL (ref 6.5–8.1)

## 2021-09-30 LAB — D-DIMER, QUANTITATIVE: D-Dimer, Quant: <0.27 ug/mL-FEU (ref 0.00–0.50)

## 2021-09-30 LAB — TROPONIN I (HIGH SENSITIVITY): Troponin I (High Sensitivity): 5 ng/L (ref <18)

## 2021-09-30 MED ORDER — PANTOPRAZOLE SODIUM 20 MG PO TBEC: 20.0000 mg | DELAYED_RELEASE_TABLET | Freq: Every day | ORAL | 0 refills | Status: AC

## 2021-09-30 MED ORDER — LIDOCAINE VISCOUS HCL 2 % MT SOLN
15.0000 mL | Freq: Once | OROMUCOSAL | Status: DC
  Filled 2021-09-30: qty 15

## 2021-09-30 MED ORDER — POTASSIUM CHLORIDE 20 MEQ PO PACK
40.0000 meq | PACK | Freq: Once | ORAL | Status: AC
  Administered 2021-09-30: 40 meq via ORAL
  Filled 2021-09-30: qty 2

## 2021-09-30 MED ORDER — FAMOTIDINE 20 MG PO TABS
20.0000 mg | ORAL_TABLET | Freq: Once | ORAL | Status: AC
Start: 2021-09-30 — End: 2021-09-30
  Administered 2021-09-30: 20 mg via ORAL
  Filled 2021-09-30: qty 1

## 2021-09-30 MED ORDER — ALUM & MAG HYDROXIDE-SIMETH 200-200-20 MG/5ML PO SUSP
30.0000 mL | Freq: Once | ORAL | Status: DC
  Filled 2021-09-30: qty 30

## 2021-09-30 NOTE — ED Triage Notes (Signed)
Sob for months. States for 2 weeks he has had a pinching feeling that comes and goes in his left chest. Worse with a deep breath.

## 2021-09-30 NOTE — ED Provider Notes (Signed)
[facility] EMERGENCY DEPARTMENT Provider Note   CSN: [id number] Arrival date & time: 09/30/21  1409     History  Chief Complaint  Patient presents with   Shortness of Breath    Darin Winters is a 56 y.o. male.  56 y.o male with no PMH presents to the ED with chief complaint of shortness of breath, palpitations have been ongoing for over a month.  Patient describes this occurs intermittently, he feels a fluttering sensation to his left chest.  All of the symptoms are exacerbated when he lays flat at night.  He was previously seen in the emergency department and treated with pantoprazole.  He also was seen by his PCP on 23 December who obtained an x-ray which was negative for any pneumonia.  Does have a prior history of smoking but quit several years ago.  He does endorse a dry cough, with no sputum.  No alleviating factors.  He not had any prior cardiac work-up in the past.  No fever, no prior history of CAD, no family history of CAD.  The history is provided by the patient and medical records.  Shortness of Breath Severity:  Moderate Onset quality:  Gradual Duration:  1 month Timing:  Intermittent Progression:  Worsening Chronicity:  New Associated symptoms: cough   Associated symptoms: no abdominal pain, no chest pain, no fever, no sore throat and no vomiting       Home Medications Prior to Admission medications   Medication Sig Start Date End Date Taking? Authorizing Provider  losartan (COZAAR) 100 MG tablet Take 0.5 tablets (50 mg total) by mouth daily. 07/01/15  Yes Hedges, Tinnie Gens, PA-C  pantoprazole (PROTONIX) 20 MG tablet Take 1 tablet (20 mg total) by mouth daily for 7 days. 09/30/21 10/07/21 Yes Myrissa Chipley, Leonie Douglas, PA-C  simvastatin (ZOCOR) 10 MG tablet Take 10 mg by mouth at bedtime.     Yes [provider]  meloxicam (MOBIC) 7.5 MG tablet Take 2 tablets (15 mg total) by mouth daily. 06/17/17   Garlon Hatchet, PA-C      Allergies    Patient has no known  allergies.    Review of Systems   Review of Systems  Constitutional:  Negative for fever.  HENT:  Negative for sinus pressure and sore throat.   Respiratory:  Positive for cough and shortness of breath.   Cardiovascular:  Positive for palpitations. Negative for chest pain.  Gastrointestinal:  Negative for abdominal pain, nausea and vomiting.  Genitourinary:  Negative for flank pain.  Musculoskeletal:  Negative for back pain.  All other systems reviewed and are negative.  Physical Exam Updated Vital Signs BP (!) 154/96    Pulse 96    Temp 98.2 F (36.8 C) (Oral)    Resp 15    Ht 6' (1.829 m)    Wt 123.4 kg    SpO2 96%    BMI 36.89 kg/m  Physical Exam Vitals and nursing note reviewed.  Constitutional:      Appearance: He is well-developed.  HENT:     Head: Normocephalic and atraumatic.  Eyes:     General: No scleral icterus.    Pupils: Pupils are equal, round, and reactive to light.  Cardiovascular:     Heart sounds: Normal heart sounds.     Comments: NO BL pitting edema, no calf tenderness.  Pulmonary:     Effort: Pulmonary effort is normal.     Breath sounds: Normal breath sounds. No wheezing.  Chest:  Chest wall: No tenderness.  Abdominal:     General: Bowel sounds are normal. There is no distension.     Palpations: Abdomen is soft.     Tenderness: There is no abdominal tenderness.  Musculoskeletal:        General: No tenderness or deformity.     Cervical back: Normal range of motion.     Right lower leg: No edema.     Left lower leg: No edema.  Skin:    General: Skin is warm and dry.  Neurological:     Mental Status: He is alert and oriented to person, place, and time.    ED Results / Procedures / Treatments   Labs (all labs ordered are listed, but only abnormal results are displayed) Labs Reviewed  CBC WITH DIFFERENTIAL/PLATELET - Abnormal; Notable for the following components:      Result Value   RBC 5.93 (*)    All other components within normal limits   COMPREHENSIVE METABOLIC PANEL - Abnormal; Notable for the following components:   Sodium 134 (*)    Potassium 3.3 (*)    Glucose, Bld 103 (*)    All other components within normal limits  D-DIMER, QUANTITATIVE  TROPONIN I (HIGH SENSITIVITY)    EKG None  Radiology DG Chest 2 View  Result Date: 09/30/2021 CLINICAL DATA:  Shortness of breath. EXAM: CHEST - 2 VIEW COMPARISON:  Chest XR, 05/03/2011.  CT chest, 12/23/2020. FINDINGS: Cardiomediastinal silhouette is within normal limits. Lungs are well inflated. No focal consolidation or mass. No pleural effusion or pneumothorax. No acute displaced fracture. IMPRESSION: Normal chest. Electronically Signed   By: Roanna Banning M.D.   On: 09/30/2021 17:21    Procedures Procedures    Medications Ordered in ED Medications  famotidine (PEPCID) tablet 20 mg (has no administration in time range)  potassium chloride (KLOR-CON) packet 40 mEq (40 mEq Oral Given 09/30/21 1828)    ED Course/ Medical Decision Making/ A&P                           Medical Decision Making  Patient presents to the ED with ongoing shortness of breath, nonproductive cough for the past month.  Previously evaluated by PCP, with negative x-ray for any pneumonia.  Presents today as he feels like he is hard to catch his breath.  He does report symptoms are worsened at night when he lays down flat.  During my evaluation patient's blood pressure slightly elevated, heart rate is slightly elevated in the lower teens.  Oxygen saturation is 95% on room air but does not have any tachypnea on my exam.  His lungs are clear to auscultation.  There is no signs of fluid overload, no pitting edema, no calf tenderness.  No prior history of CAD but does report some fluttering to his left chest, will obtain troponin along with EKG.  No prior history of blood clots, negative CT angio and the year of 2020 after similar presentation and symptoms.  Denies any history of smoking, no fevers.  Will  obtain labs along with further evaluate.  6:19 PM patient reevaluated by me.  Interpretation of his labs reveal a CBC with no leukocytosis, hemoglobin is within normal limits.  CMP with slight decrease in his sodium, potassium was 3.3, will replace this orally  Levels within normal limits.  LFTs are unremarkable.  We obtained a D-dimer as patient was preplanning of palpitations along with shortness of breath.  This  is negative on today's visit, he is not tachycardic, not hypoxic Avva lower suspicion for pulmonary embolism.  First troponin is negative, pain has been ongoing for over a month I feel that this is likely not ACS with an EKG without any ST changes.  He was given GI cocktail to help with his heartburn, did discuss follow-up with cardiology is recommended.  He understands and agrees to intervention this time, patient is stable for discharge.   Portions of this note were generated with Scientist, clinical (histocompatibility and immunogenetics)Dragon dictation software. Dictation errors may occur despite best attempts at proofreading.  Final Clinical Impression(s) / ED Diagnoses Final diagnoses:  Cough, unspecified type    Rx / DC Orders ED Discharge Orders          Ordered    pantoprazole (PROTONIX) 20 MG tablet  Daily        09/30/21 1851              Claude MangesSoto, Gualberto Wahlen, PA-C 09/30/21 1852    Pollyann SavoySheldon, Charles B, MD 09/30/21 2023

## 2021-09-30 NOTE — Discharge Instructions (Addendum)
Your laboratory results are within normal limits on today's visit.  I discussed with you the results of your chest x-ray.  I do recommend that you follow-up with a cardiologist, if cough continues.  You experience any worsening symptoms, chest pain, shortness of breath you will need to return to the emergency department.

## 2024-03-01 ENCOUNTER — Other Ambulatory Visit (HOSPITAL_COMMUNITY): Payer: Self-pay | Admitting: Family Medicine

## 2024-03-01 DIAGNOSIS — R079 Chest pain, unspecified: Secondary | ICD-10-CM

## 2024-03-14 ENCOUNTER — Telehealth (HOSPITAL_COMMUNITY): Payer: Self-pay | Admitting: *Deleted

## 2024-03-14 MED ORDER — METOPROLOL TARTRATE 100 MG PO TABS: ORAL_TABLET | ORAL | 0 refills | Status: AC

## 2024-03-14 NOTE — Telephone Encounter (Signed)
 Reaching out to patient to offer assistance regarding upcoming cardiac imaging study; pt verbalizes understanding of appt date/time, parking situation and where to check in, pre-test NPO status and medications ordered, and verified current allergies; name and call back number provided for further questions should they arise Johney Frame RN Navigator Cardiac Imaging [facility] [phone] office [phone] cell

## 2024-03-15 ENCOUNTER — Ambulatory Visit (HOSPITAL_COMMUNITY)
Admission: RE | Admit: 2024-03-15 | Discharge: 2024-03-15 | Disposition: A | Discharge status: Home / Self Care | Source: Ambulatory Visit | Attending: Family Medicine | Admitting: Family Medicine

## 2024-03-15 DIAGNOSIS — I700 Atherosclerosis of aorta: Secondary | ICD-10-CM | POA: Diagnosis not present

## 2024-03-15 DIAGNOSIS — R079 Chest pain, unspecified: Secondary | ICD-10-CM | POA: Diagnosis present

## 2024-03-15 MED ORDER — NITROGLYCERIN 0.4 MG SL SUBL
0.8000 mg | SUBLINGUAL_TABLET | Freq: Once | SUBLINGUAL | Status: AC
  Administered 2024-03-15: 0.8 mg via SUBLINGUAL

## 2024-03-15 MED ORDER — IOHEXOL 350 MG/ML SOLN
100.0000 mL | Freq: Once | INTRAVENOUS | Status: AC | PRN
  Administered 2024-03-15: 100 mL via INTRAVENOUS

## 2024-08-11 ENCOUNTER — Other Ambulatory Visit (HOSPITAL_COMMUNITY): Payer: Self-pay | Admitting: Internal Medicine
